# Patient Record
Sex: Male | Born: 1945 | Race: White | Hispanic: No | Marital: Married | Smoking: Never smoker
Health system: Southern US, Community
[De-identification: ages and names within clinical notes are randomized; demographics above are authoritative.]

---

## 2004-11-20 ENCOUNTER — Ambulatory Visit: Payer: Self-pay | Admitting: Nephrology

## 2006-11-01 ENCOUNTER — Emergency Department: Payer: Self-pay | Admitting: Unknown Physician Specialty

## 2008-09-19 IMAGING — CT CT HEAD WITHOUT CONTRAST
2 series · 16 of 30 positions shown, 20 images · non-contrast
Comparison: none

REASON FOR EXAM: ha   pt in er [place]
COMMENTS:

[Series 2: without · axial · non-contrast · 0.41mm/px · z∈[-94,+32]mm · 13 of 31 slices shown, 17 images]
[im 3/31  brain]
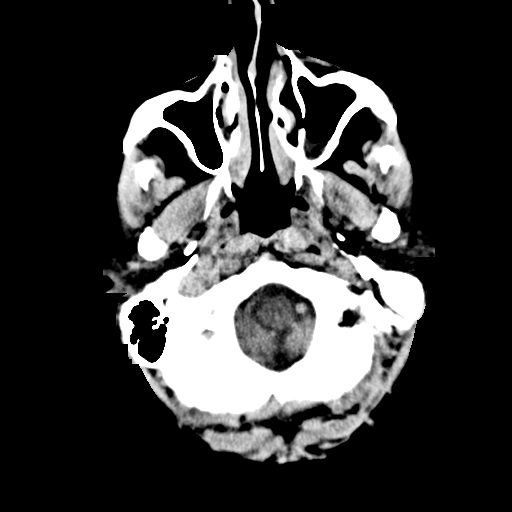
[im 3/31  bone]
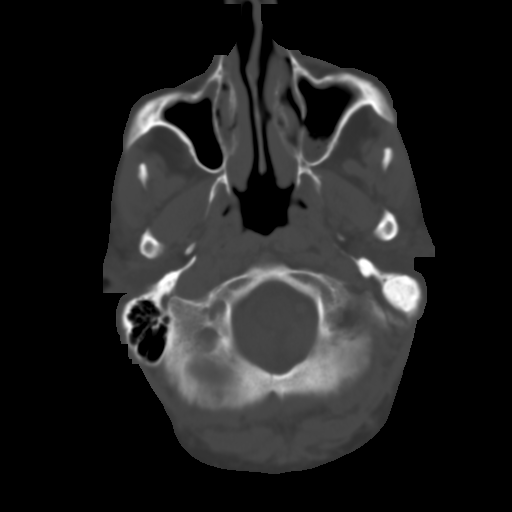
[im 5/31  brain]
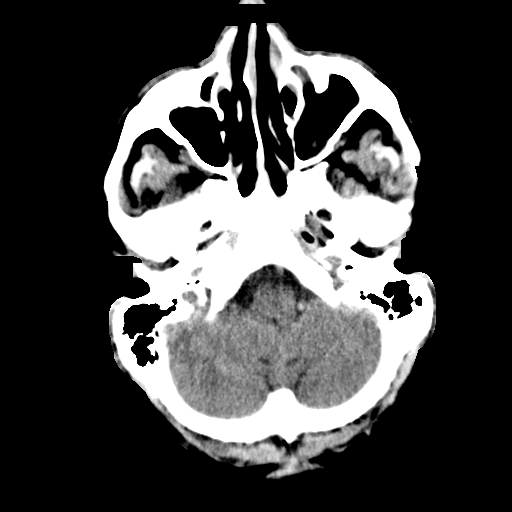
[im 7/31  brain]
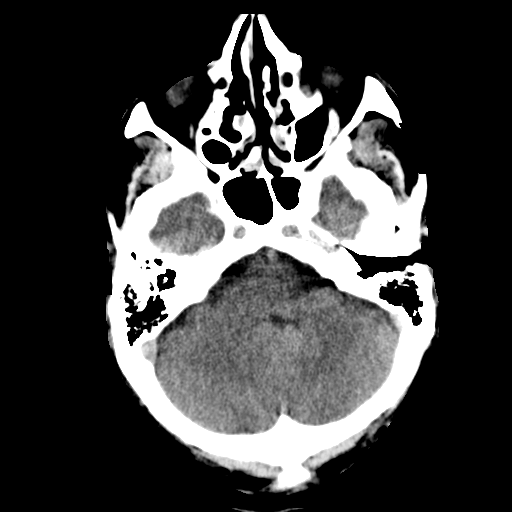
[im 9/31  brain]
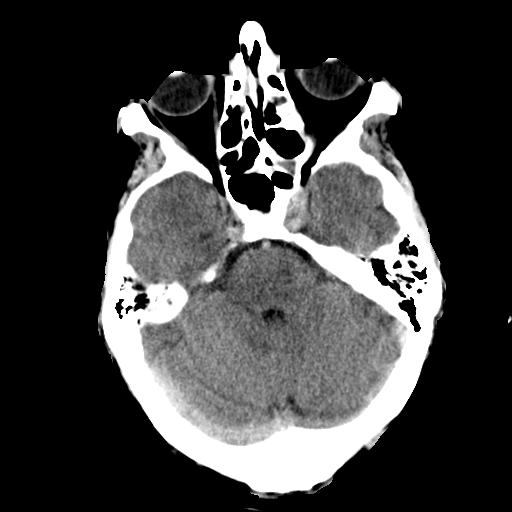
[im 11/31  brain]
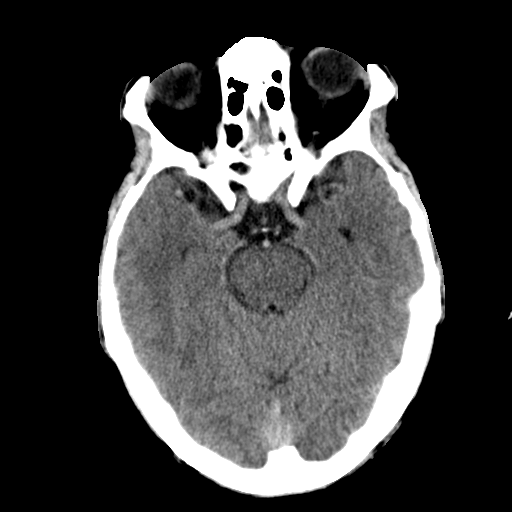
[im 11/31  bone]
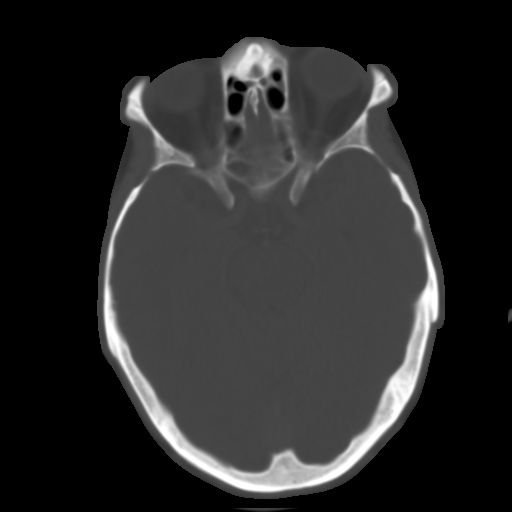
[im 13/31  brain]
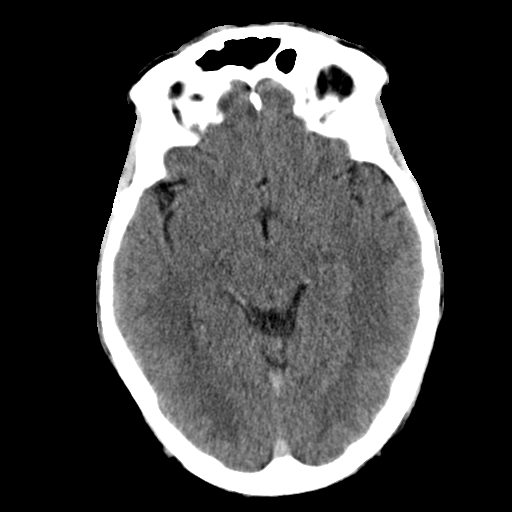
[im 16/31  brain]
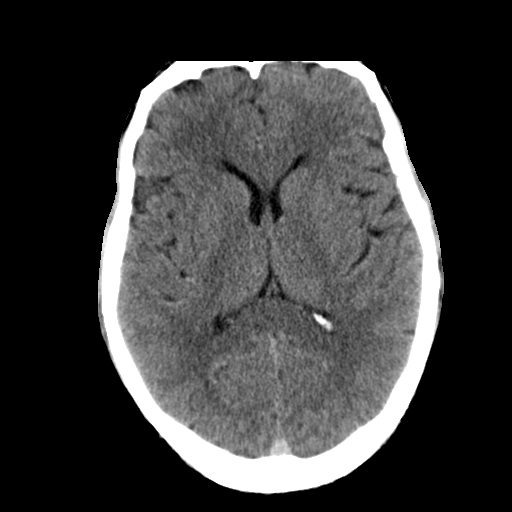
[im 18/31  brain]
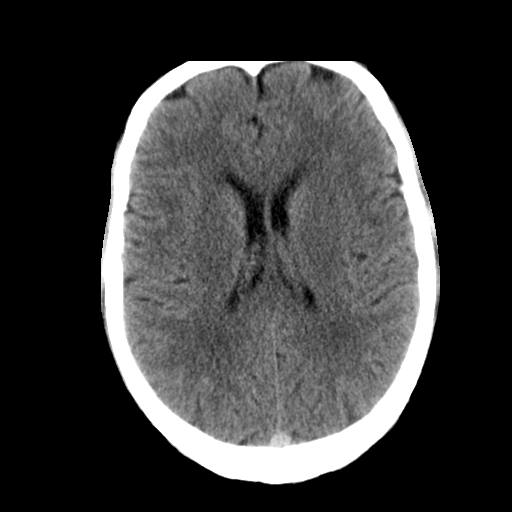
[im 20/31  brain]
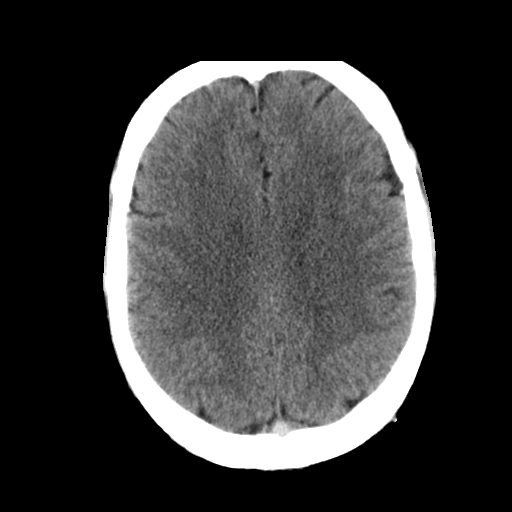
[im 20/31  bone]
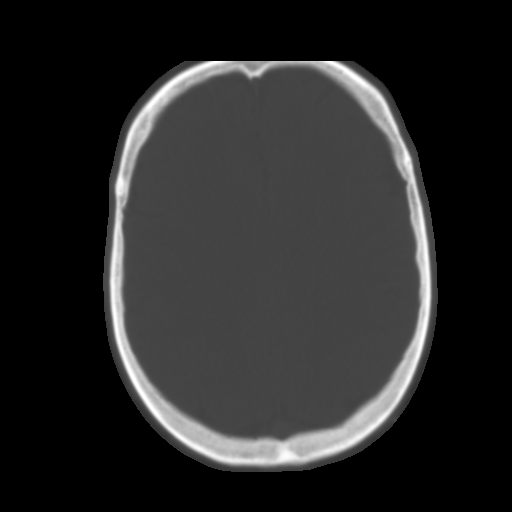
[im 22/31  brain]
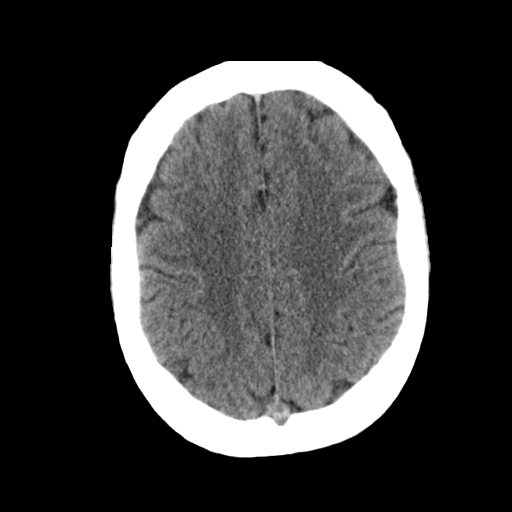
[im 24/31  brain]
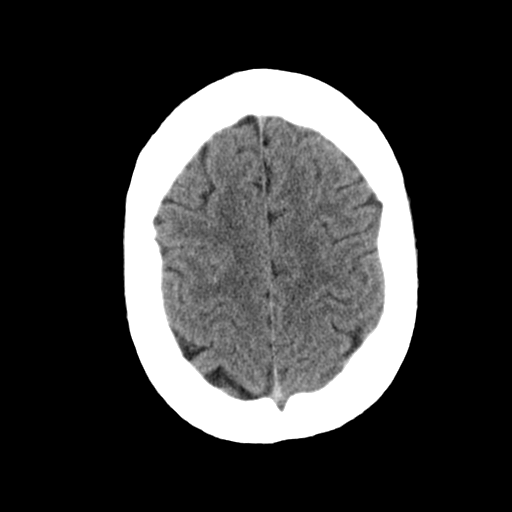
[im 26/31  brain]
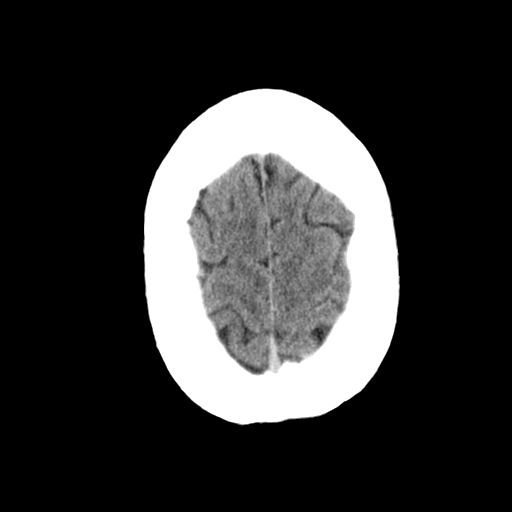
[im 28/31  brain]
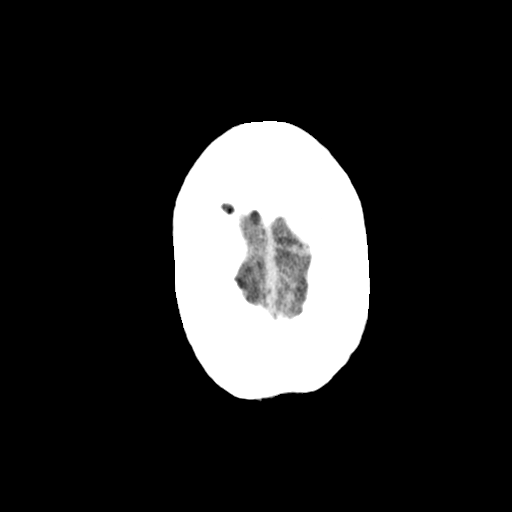
[im 28/31  bone]
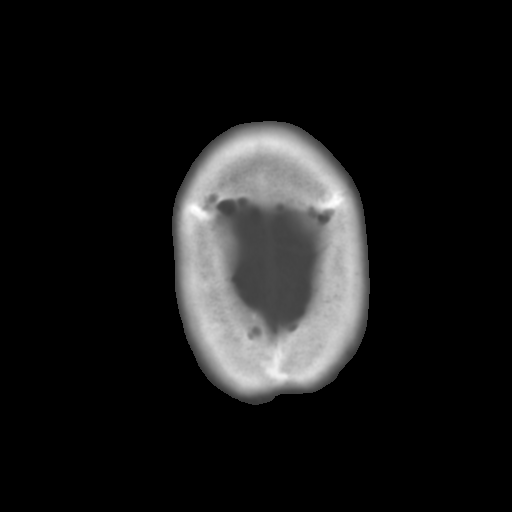

[Series 3: bone · axial · 0.41mm/px · z∈[-94,-54]mm · 3 of 31 slices shown]
[im 3/31  bone]
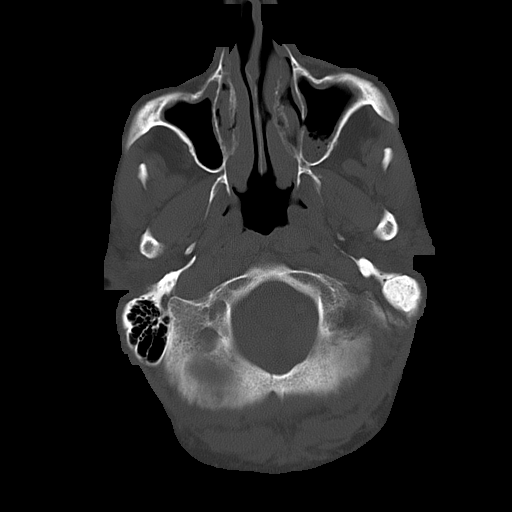
[im 7/31  bone]
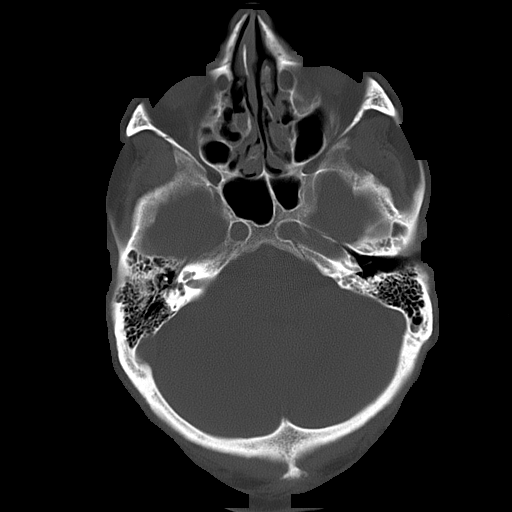
[im 11/31  bone]
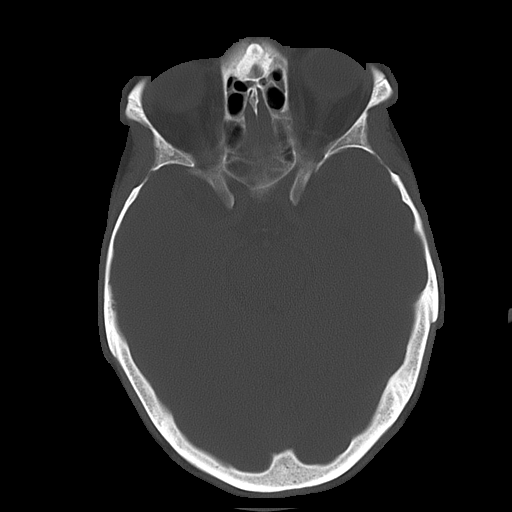

[16 of 30 positions shown; findings below may reference images not displayed]

PROCEDURE:     CT  - CT HEAD WITHOUT CONTRAST  - November 01, 2006  [DATE]

RESULT:       Unenhanced emergent Head CT was performed for headaches.  No
subarachnoid hemorrhage, no intracerebral bleeds, no shift of the midline.
The ventricles appear within normal limits.  No extraaxial fluid collections
are noted.

On the bone window settings, there is noted LEFT frontal, bilateral ethmoid
opacifications and mucoperiosteal thickening of the maxillary sinuses.  The
findings can be compatible with sinusitis.
IMPRESSION: 1.     No acute intracranial abnormality is seen.
2.     Sinusitis as described above.

Results were called to the Emergency Room.

## 2009-10-22 ENCOUNTER — Ambulatory Visit: Payer: Self-pay | Admitting: Gastroenterology

## 2010-02-05 ENCOUNTER — Ambulatory Visit: Payer: Self-pay | Admitting: Internal Medicine

## 2010-02-28 ENCOUNTER — Ambulatory Visit: Payer: Self-pay | Admitting: Internal Medicine

## 2010-03-07 ENCOUNTER — Ambulatory Visit: Payer: Self-pay | Admitting: Internal Medicine

## 2010-04-17 ENCOUNTER — Ambulatory Visit: Payer: Self-pay | Admitting: Internal Medicine

## 2010-05-08 ENCOUNTER — Ambulatory Visit: Payer: Self-pay | Admitting: Internal Medicine

## 2010-06-07 ENCOUNTER — Ambulatory Visit: Payer: Self-pay | Admitting: Internal Medicine

## 2010-07-08 ENCOUNTER — Ambulatory Visit: Payer: Self-pay | Admitting: Internal Medicine

## 2010-08-07 ENCOUNTER — Ambulatory Visit: Payer: Self-pay | Admitting: Internal Medicine

## 2010-09-07 ENCOUNTER — Ambulatory Visit: Payer: Self-pay | Admitting: Internal Medicine

## 2010-10-08 ENCOUNTER — Ambulatory Visit: Payer: Self-pay | Admitting: Internal Medicine

## 2012-04-08 IMAGING — US ABDOMEN ULTRASOUND
1 series · 17 of 25 positions shown · non-contrast
Comparison: none

REASON FOR EXAM: POLYCYTHEMIA EVAL FOR SPLENOMEGOLY
COMMENTS:

[Series 1: abdomen ultrasound · 17 of 95 slices shown]
[im 1/95]
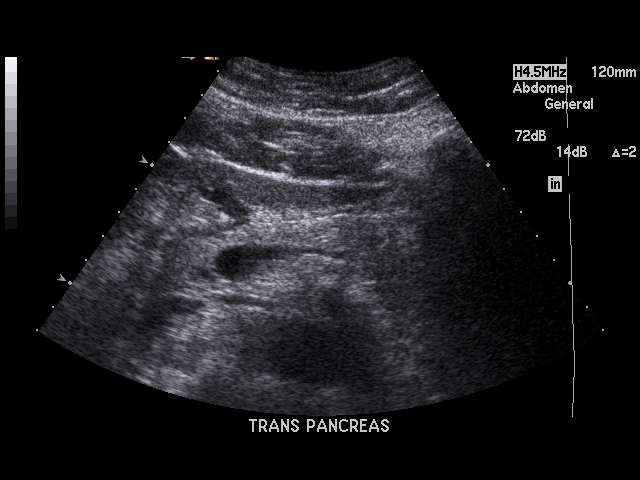
[im 8/95]
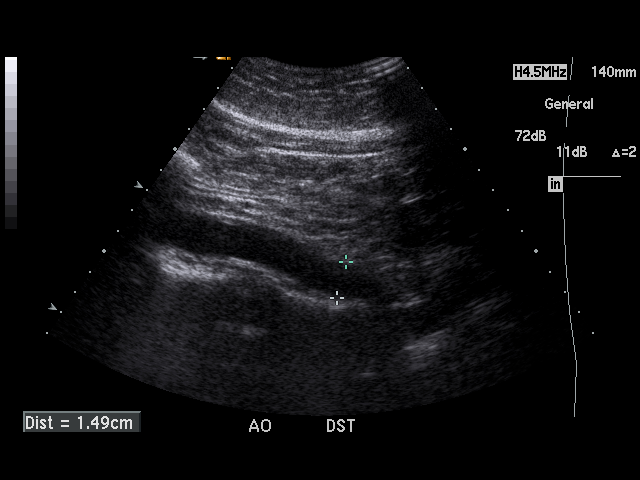
[im 12/95]
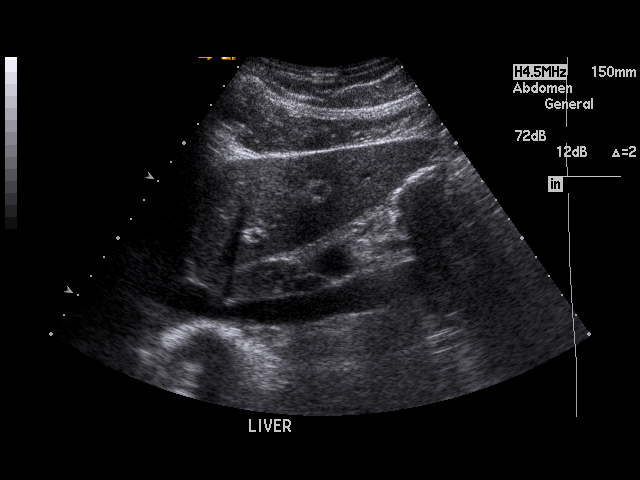
[im 20/95]
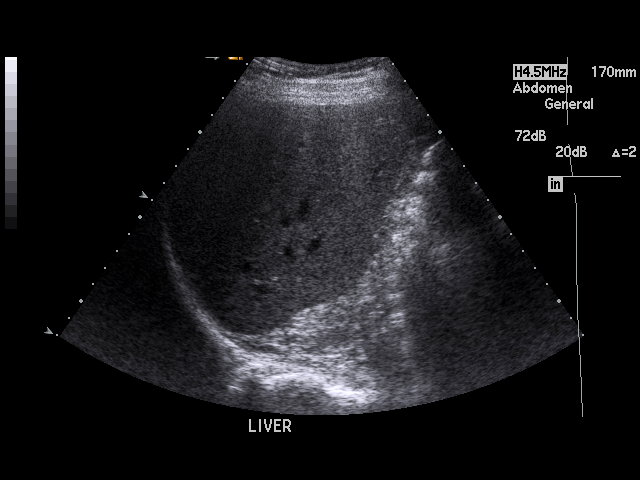
[im 24/95]
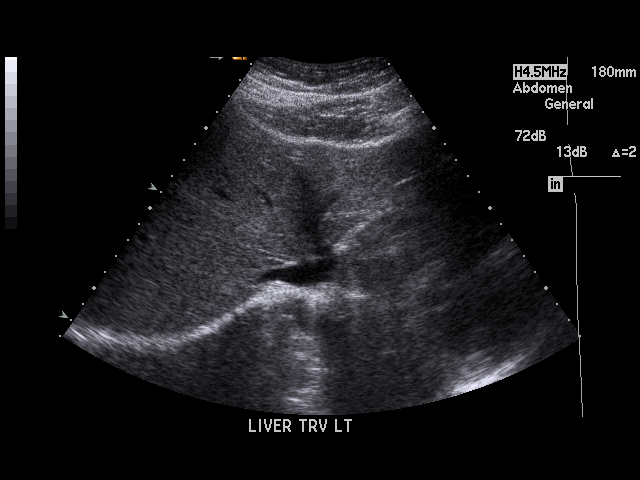
[im 32/95]
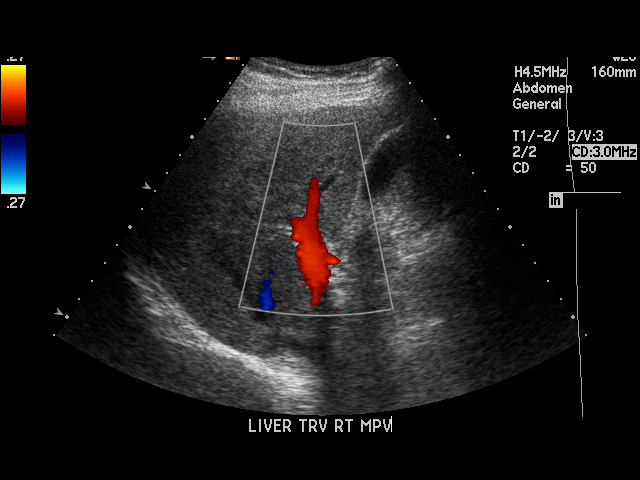
[im 36/95]
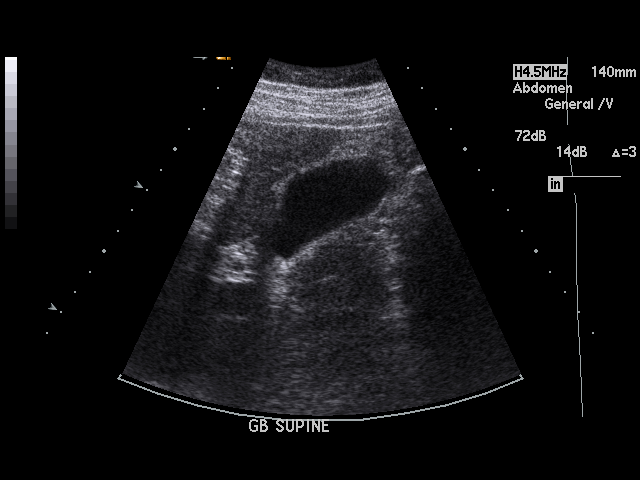
[im 44/95]
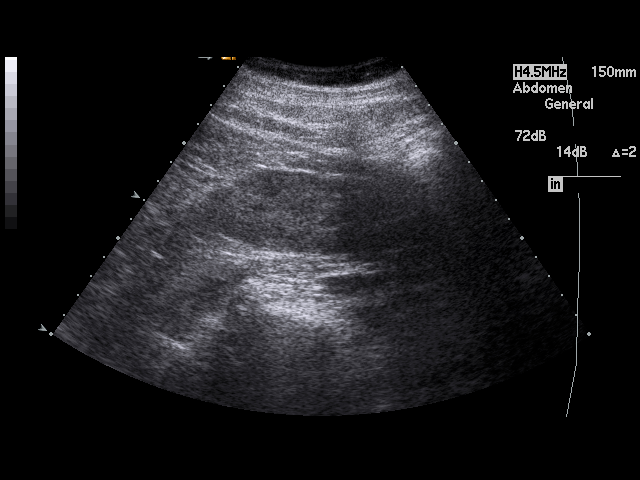
[im 48/95]
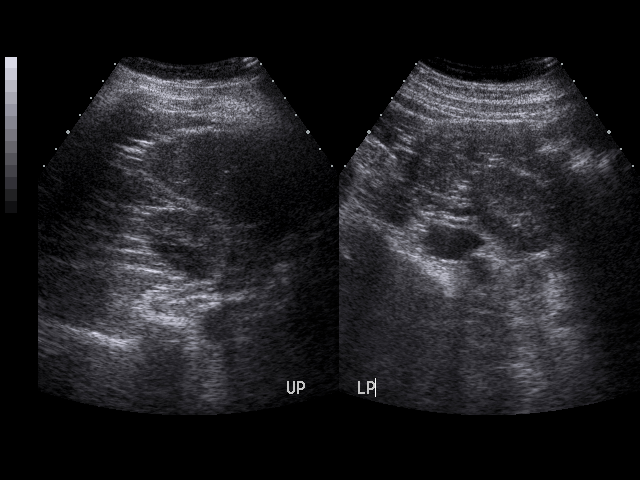
[im 51/95]
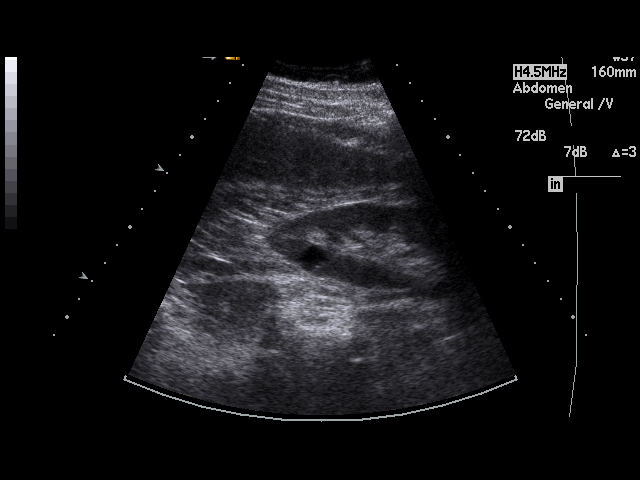
[im 59/95]
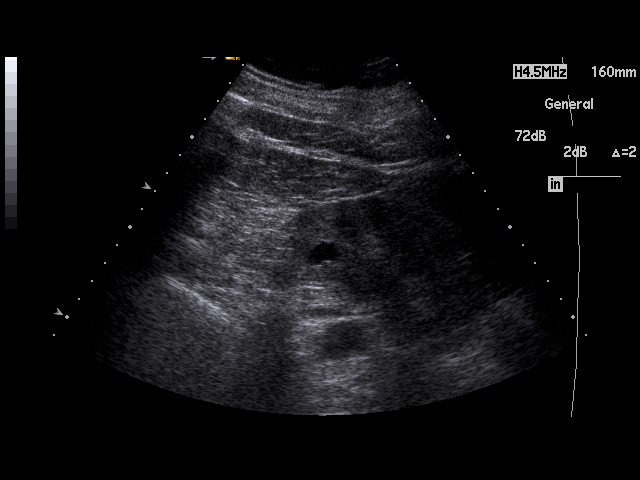
[im 63/95]
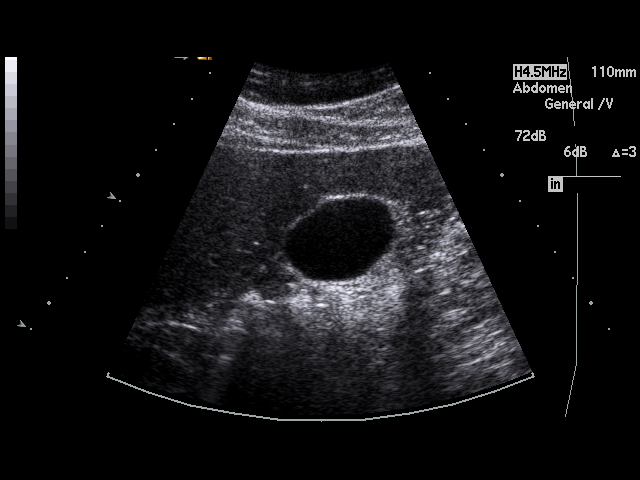
[im 71/95]
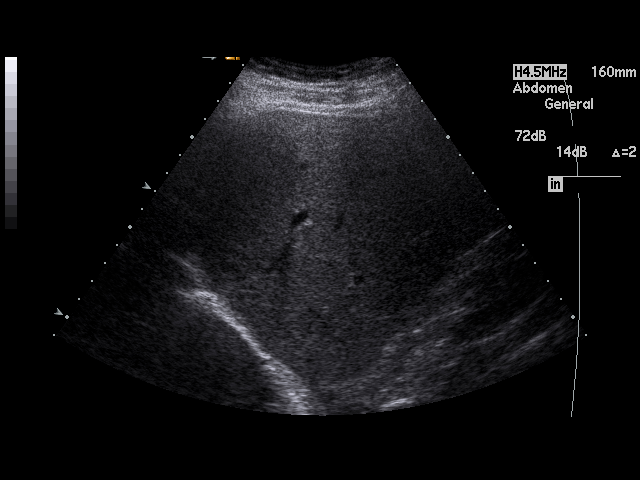
[im 75/95]
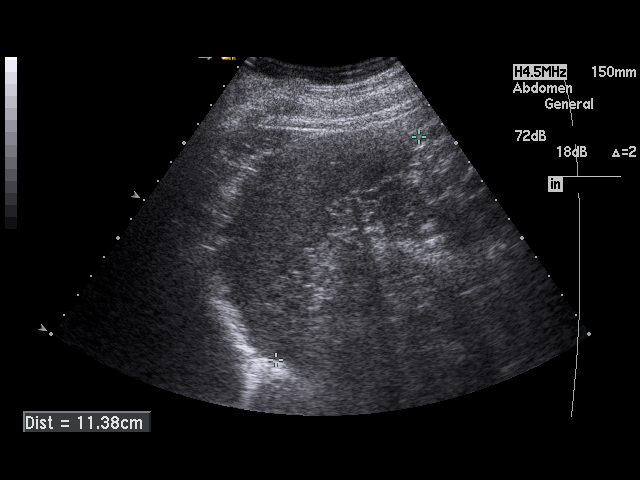
[im 83/95]
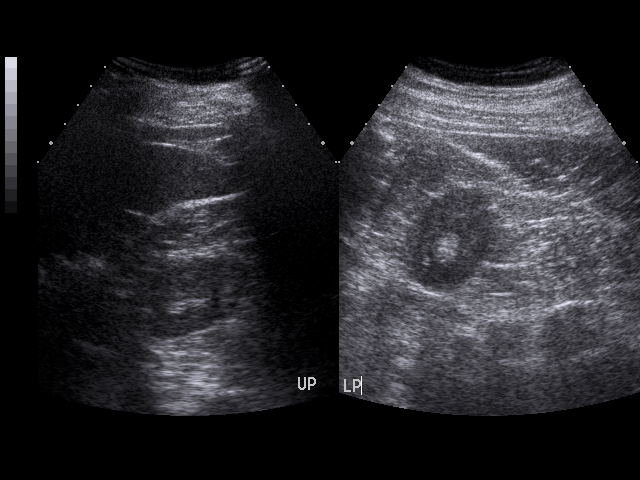
[im 87/95]
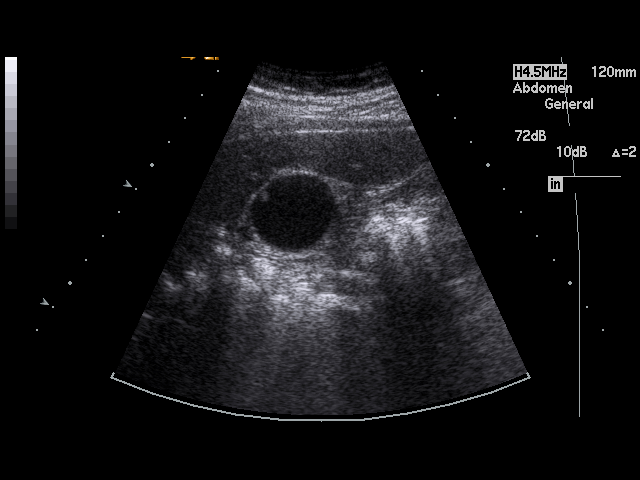
[im 95/95]
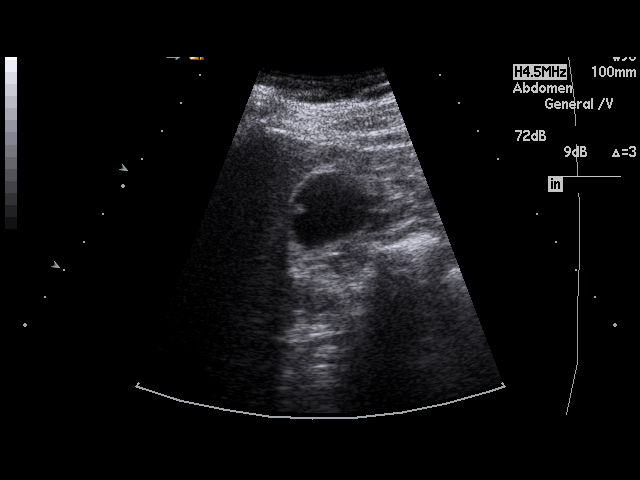

[17 of 25 positions shown; findings below may reference images not displayed]

PROCEDURE:     US  - US ABDOMEN GENERAL SURVEY  - May 21, 2010  [DATE]

RESULT:     The liver demonstrates a homogeneous echotexture. Hepatopetal
flow is identified within the portal vein. The aorta and IVC, visualized
portions, are unremarkable. The pancreas is partially visualized and is
unremarkable. The spleen measures 11.37 cm in longitudinal dimension and
demonstrates a homogeneous echotexture.

Evaluation of the gallbladder fossa demonstrates a polyp measuring 3.9 mm.
Gallbladder wall thickness is 2.1 mm and the common bile duct measures
mm in diameter. The patient does not demonstrate a sonographic Murphy's
sign. A cyst is appreciated involving the right kidney measuring 1.46 x
cm in diameter. The left kidney is otherwise unremarkable.
IMPRESSION: 1.     Small right kidney.
2.     The spleen appears intact and there is no evidence of splenomegaly.
3.     No further focal or acute abnormalities identified.

## 2022-07-28 ENCOUNTER — Observation Stay: Payer: BC Managed Care – PPO

## 2022-07-28 ENCOUNTER — Observation Stay
Admission: EM | Admit: 2022-07-28 | Discharge: 2022-07-29 | Disposition: A | Discharge status: Home / Self Care | Payer: BC Managed Care – PPO | Attending: Internal Medicine | Admitting: Internal Medicine

## 2022-07-28 ENCOUNTER — Emergency Department: Payer: BC Managed Care – PPO

## 2022-07-28 ENCOUNTER — Observation Stay
Admit: 2022-07-28 | Discharge: 2022-07-28 | Disposition: A | Discharge status: Home / Self Care | Payer: BC Managed Care – PPO | Attending: Family Medicine | Admitting: Family Medicine

## 2022-07-28 ENCOUNTER — Other Ambulatory Visit: Payer: Self-pay

## 2022-07-28 DIAGNOSIS — I10 Essential (primary) hypertension: Secondary | ICD-10-CM | POA: Diagnosis not present

## 2022-07-28 DIAGNOSIS — R55 Syncope and collapse: Secondary | ICD-10-CM | POA: Diagnosis not present

## 2022-07-28 DIAGNOSIS — G8929 Other chronic pain: Secondary | ICD-10-CM

## 2022-07-28 DIAGNOSIS — R519 Headache, unspecified: Secondary | ICD-10-CM | POA: Diagnosis not present

## 2022-07-28 DIAGNOSIS — R748 Abnormal levels of other serum enzymes: Secondary | ICD-10-CM | POA: Diagnosis not present

## 2022-07-28 DIAGNOSIS — R7989 Other specified abnormal findings of blood chemistry: Secondary | ICD-10-CM | POA: Diagnosis not present

## 2022-07-28 LAB — COMPREHENSIVE METABOLIC PANEL
ALT: 20 U/L (ref 0–44)
AST: 22 U/L (ref 15–41)
Albumin: 3.8 g/dL (ref 3.5–5.0)
Alkaline Phosphatase: 105 U/L (ref 38–126)
Anion gap: 10 (ref 5–15)
BUN: 52 mg/dL — ABNORMAL HIGH (ref 8–23)
CO2: 19 mmol/L — ABNORMAL LOW (ref 22–32)
Calcium: 9 mg/dL (ref 8.9–10.3)
Chloride: 108 mmol/L (ref 98–111)
Creatinine, Ser: 2.82 mg/dL — ABNORMAL HIGH (ref 0.61–1.24)
GFR, Estimated: 23 mL/min — ABNORMAL LOW (ref >60)
Glucose, Bld: 116 mg/dL — ABNORMAL HIGH (ref 70–99)
Potassium: 4 mmol/L (ref 3.5–5.1)
Sodium: 137 mmol/L (ref 135–145)
Total Bilirubin: 1.1 mg/dL (ref 0.3–1.2)
Total Protein: 6.9 g/dL (ref 6.5–8.1)

## 2022-07-28 LAB — URINALYSIS, COMPLETE (UACMP) WITH MICROSCOPIC
Bacteria, UA: NONE SEEN
Bilirubin Urine: NEGATIVE
Glucose, UA: NEGATIVE mg/dL
Ketones, ur: NEGATIVE mg/dL
Leukocytes,Ua: NEGATIVE
Nitrite: NEGATIVE
Protein, ur: 100 mg/dL — AB
RBC / HPF: >50 RBC/hpf — ABNORMAL HIGH (ref 0–5)
Specific Gravity, Urine: 1.015 (ref 1.005–1.030)
pH: 5 (ref 5.0–8.0)

## 2022-07-28 LAB — CBC WITH DIFFERENTIAL/PLATELET
Abs Immature Granulocytes: 0.06 10*3/uL (ref 0.00–0.07)
Basophils Absolute: 0.1 10*3/uL (ref 0.0–0.1)
Basophils Relative: 1 %
Eosinophils Absolute: 0.2 10*3/uL (ref 0.0–0.5)
Eosinophils Relative: 2 %
HCT: 48.5 % (ref 39.0–52.0)
Hemoglobin: 16.1 g/dL (ref 13.0–17.0)
Immature Granulocytes: 1 %
Lymphocytes Relative: 20 %
Lymphs Abs: 2 10*3/uL (ref 0.7–4.0)
MCH: 30 pg (ref 26.0–34.0)
MCHC: 33.2 g/dL (ref 30.0–36.0)
MCV: 90.3 fL (ref 80.0–100.0)
Monocytes Absolute: 0.6 10*3/uL (ref 0.1–1.0)
Monocytes Relative: 6 %
Neutro Abs: 7.2 10*3/uL (ref 1.7–7.7)
Neutrophils Relative %: 70 %
Platelets: 237 10*3/uL (ref 150–400)
RBC: 5.37 MIL/uL (ref 4.22–5.81)
RDW: 12.3 % (ref 11.5–15.5)
WBC: 10.1 10*3/uL (ref 4.0–10.5)
nRBC: 0 % (ref 0.0–0.2)

## 2022-07-28 LAB — TROPONIN I (HIGH SENSITIVITY)
Troponin I (High Sensitivity): 7 ng/L (ref <18)
Troponin I (High Sensitivity): 10 ng/L (ref <18)

## 2022-07-28 LAB — D-DIMER, QUANTITATIVE: D-Dimer, Quant: 1.23 ug/mL-FEU — ABNORMAL HIGH (ref 0.00–0.50)

## 2022-07-28 MED ORDER — FENTANYL CITRATE PF 50 MCG/ML IJ SOSY
50.0000 ug | PREFILLED_SYRINGE | Freq: Once | INTRAMUSCULAR | Status: AC
  Administered 2022-07-28: 50 ug via INTRAVENOUS
  Filled 2022-07-28: qty 1

## 2022-07-28 MED ORDER — NORTRIPTYLINE HCL 10 MG PO CAPS
10.0000 mg | ORAL_CAPSULE | Freq: Every day | ORAL | Status: DC
  Administered 2022-07-28: 10 mg via ORAL
  Filled 2022-07-28: qty 1

## 2022-07-28 MED ORDER — HEPARIN BOLUS VIA INFUSION
5000.0000 [IU] | Freq: Once | INTRAVENOUS | Status: AC
  Administered 2022-07-28: 5000 [IU] via INTRAVENOUS
  Filled 2022-07-28: qty 5000

## 2022-07-28 MED ORDER — LACTATED RINGERS IV BOLUS
1000.0000 mL | Freq: Once | INTRAVENOUS | Status: AC
  Administered 2022-07-28: 1000 mL via INTRAVENOUS

## 2022-07-28 MED ORDER — HEPARIN (PORCINE) 25000 UT/250ML-% IV SOLN
1400.0000 [IU]/h | INTRAVENOUS | Status: DC
  Administered 2022-07-28: 1400 [IU]/h via INTRAVENOUS
  Filled 2022-07-28: qty 250

## 2022-07-28 MED ORDER — LISINOPRIL 10 MG PO TABS
20.0000 mg | ORAL_TABLET | Freq: Every day | ORAL | Status: DC
  Administered 2022-07-29: 20 mg via ORAL
  Filled 2022-07-28: qty 2

## 2022-07-28 NOTE — Assessment & Plan Note (Signed)
-   Creatinine 2.82 - Last creatinine in our system was in 2018 1.7 - Patient reports that he does have stage IV kidney disease - Baseline is likely in the twos, but this is probably elevated for him given the BUN to creatinine ratio of 52:2.82, and decreased bicarb at 19 - Continue gentle hydration - Holding hydrochlorothiazide, reducing lisinopril - Continue to monitor

## 2022-07-28 NOTE — Assessment & Plan Note (Signed)
-   Elevated D-dimer is 1.23 - Patient has had some tachypnea and hypotension with paramedics - Concern for pulmonary embolism - VQ scan in the a.m. secondary to elevated creatinine - Heparin drip was started in the ED, continue - VQ was positive for pulmonary embolism, will proceed with PE work-up

## 2022-07-28 NOTE — H&P (Signed)
History and Physical    Patient: Jackson Ray WUJ:[id number] DOB: 04-05-1946 DOA: 07/28/2022 DOS: the patient was seen and examined on 07/28/2022 PCP: System, Provider Not In  Patient coming from: Home  Chief Complaint:  Chief Complaint  Patient presents with   Near Syncope    Pt arrived via ems, for near syncopal episodes.  Pt states he had 2 episodes yesterday where he felt lightheaded and dizzy.  This morning he states he fell twice this morning when waking up to go to the bathroom, denies hitting head. Fell 2x unwitnessed at work and 1x witnessed where he hit his head.  Bump/small laceration noted on the top left side of head.  Pt denies LOC.  Finished antibiotics for recent uti, also states blood in urine.  Has self cathed 5-6x over the last month. Upon arrival hands wer   HPI: Jackson Ray is a 76 y.o. male with medical history significant of prostate disease, hypertension, CSF leak, chronic headaches, and more presents to the ED with a chief complaint of syncope.  Patient reports he started having dizziness upon standing day before yesterday.  In the middle of the night, when he woke up to go to the bathroom, he passed out on the way to the bathroom and on the way back to bed.  He thought he might be unable to go to work today, but when he woke up he was feeling okay so he went to work.  He reports at work he had 4 syncopal episodes.  Last when he hit his head pretty hard and was unconscious longer, so work made him come into the hospital.  Patient reports that the syncopal episodes never happen when he is sitting or laying.  They only happen when he gets up and walks a few steps.  He feels dizziness briefly beforehand, but reports that he pretty suddenly passes out.  He denies any chest pains, dyspnea, palpitations.  He denies any new medications.  The only change in his medical regimen is that he used to straight cath at night, but he started having some bleeding yesterday, so they advised  him to stop straight cathing.  Patient reports he has had dizziness and syncopal episodes like this before when he had a CSF leak.  It is difficult to piece together, but the CSF leak is apparently spontaneous, and he had several blood patches before procedure was done to permanently fix the problem.  Patient reports that since that time he has had a headache every day.  Most days it is mild, some days it is worse.  He takes nortriptyline at night for this.  His headache is been no worse than normal recently.  Patient reports it isstopped up, so he has to mouth breathe which sometimes makes him feel short of breath.  He has felt no more short of breath recently than his normal.  He did hit the back of his head today on the right side.  He has a small tender knot.  He reports no change in vision, no change in hearing, no asymmetric weakness, no trouble word finding, no trouble swallowing.  Patient has no other complaints at this time.  Patient is DNR.  Patient does not smoke, does not drink, does not use illicit drugs. Review of Systems: As mentioned in the history of present illness. All other systems reviewed and are negative. History reviewed. No pertinent past medical history. History reviewed. No pertinent surgical history. Social History:  has no history on file  for tobacco use, alcohol use, and drug use.  No Known Allergies  History reviewed. No pertinent family history.  Prior to Admission medications   Not on File    Physical Exam: Vitals:   07/28/22 1803 07/28/22 1811 07/28/22 1900 07/28/22 2000  BP:   (!) 140/92 (!) 137/90  Pulse: 77  83 83  Resp: 18  (!) 31 (!) 32  Temp:   98.5 F (36.9 C)   TempSrc:   Oral   SpO2: 100%  100% 98%  Weight:  81.6 kg    Height:  6\' 2"  (1.88 m)     1.  General: Patient lying supine in bed,  no acute distress   2. Psychiatric: Alert and oriented x 3, mood and behavior normal for situation, pleasant and cooperative with exam   3.  Neurologic: Speech and language are normal, face is symmetric, moves all 4 extremities voluntarily, at baseline without acute deficits on limited exam   4. HEENMT:   tender knot on right occiput, normocephalic, pupils reactive to light, neck is supple, trachea is midline, mucous membranes are moist   5. Respiratory : Lungs are clear to auscultation bilaterally without wheezing, rhonchi, rales, no cyanosis, no increase in work of breathing or accessory muscle use   6. Cardiovascular : Heart rate normal, rhythm is regular, no murmurs, rubs or gallops, no peripheral edema, peripheral pulses palpated   7. Gastrointestinal:  Abdomen is soft, nondistended, nontender to palpation bowel sounds active, no masses or organomegaly palpated   8. Skin:  Skin is warm, dry and intact without rashes, acute lesions, or ulcers on limited exam   9.Musculoskeletal:  No acute deformities or trauma, no asymmetry in tone, no peripheral edema, peripheral pulses palpated, no tenderness to palpation in the extremities  Data Reviewed: The ED Temp 97.5-98.5, heart rate 53-86, respiratory rate 18-32, EMS reports blood pressure as low as 80/40 per son, but BP in the ED was 121/81-140/92 satting 90s to 100%.  There is 1 oxygen saturation at 70%, but it was 100% 4 minutes later with no intervention so this is likely an inaccurate reading No leukocytosis with white blood cell count 10.1, hemoglobin 16.1, platelets 237 Chemistry shows an elevated BUN 52, elevated creatinine 2.82 Trope 7, 10 D-dimer 1.23 UA shows greater than 50 red blood cells possibly due to a traumatic straight cath CT C-spine and head showed no acute intracranial or C-spine abnormality.  Tiny metal foreign body that has been present since 2008 along the right lateral side of the nose EKG shows a heart rate of 80, QTc 490, sinus rhythm Heparin drip started in the ED for PE Fentanyl given for headache Admission requested for syncope  work-up  Assessment and Plan: * Syncope - Most likely orthostatic hypotension given that this happened when patient would stand up and walk a few steps - Orthostatic vital signs every shift - Echo in the a.m. - Monitor on telemetry - Troponin 7,10 - D-dimer elevated, V/Q in the a.m. - UA shows hematuria, but patient's hemoglobin is stable at 16.1 - EKG shows a heart rate of 80, QTc 490, sinus rhythm - Continue to monitor  Chronic headaches - History of multiple CSF leaks requiring blood patch - Chronic headaches ever since - Continue home nortriptyline - Tylenol for mild pain or headache here - CT head showed no acute intracranial changes - Given fall and minor head trauma today, neurochecks every 4 hours - Continue to monitor  Essential hypertension - At baseline  patient is on max dose amlodipine, max dose lisinopril, and 25 mg of hydrochlorothiazide - Patient was hypotensive with paramedics - Holding amlodipine, hydrochlorothiazide, reducing dose of lisinopril -Continue to monitor  Elevated d-dimer - Elevated D-dimer is 1.23 - Patient has had some tachypnea and hypotension with paramedics - Concern for pulmonary embolism - VQ scan in the a.m. secondary to elevated creatinine - Heparin drip was started in the ED, continue - VQ was positive for pulmonary embolism, will proceed with PE work-up  Elevated serum creatinine - Creatinine 2.82 - Last creatinine in our system was in 2018 1.7 - Patient reports that he does have stage IV kidney disease - Baseline is likely in the twos, but this is probably elevated for him given the BUN to creatinine ratio of 52:2.82, and decreased bicarb at 19 - Continue gentle hydration - Holding hydrochlorothiazide, reducing lisinopril - Continue to monitor      Advance Care Planning:   Code Status: Not on file DNR  Consults: None so far, may consider neuro consult given history of CSF leak if no other etiology of syncope is  identified  Family Communication: Son at bedside  Severity of Illness: The appropriate patient status for this patient is OBSERVATION. Observation status is judged to be reasonable and necessary in order to provide the required intensity of service to ensure the patient's safety. The patient's presenting symptoms, physical exam findings, and initial radiographic and laboratory data in the context of their medical condition is felt to place them at decreased risk for further clinical deterioration. Furthermore, it is anticipated that the patient will be medically stable for discharge from the hospital within 2 midnights of admission.   Author: Lilyan Gilford, DO 07/28/2022 10:10 PM  For on call review [email or web address].

## 2022-07-28 NOTE — ED Notes (Signed)
Not on blood thinners 

## 2022-07-28 NOTE — ED Provider Notes (Signed)
[facility] Provider Note    Event Date/Time   First MD Initiated Contact with Patient 07/28/22 1655     (approximate)   History   Syncope  HPI  Jackson Ray is a 76 y.o. male who presents to the emergency department today via EMS because of concerns for syncope.  Patient states that he has felt dizzy since yesterday.  Overnight when he went to the bathroom and he stated that he passed out.  He then fell again in the bathroom but then felt better so went to bed and then felt good enough to go to work this morning.  While at work however he says he passed out again.  He denies any chest pain or palpitations with the syncopal episodes.  Is complaining of some head and neck pain.  Also has noticed some numbness to his left hand and foot.     Physical Exam   Triage Vital Signs: ED Triage Vitals  Enc Vitals Group     BP 07/28/22 1659 131/81     Pulse Rate 07/28/22 1659 86     Resp 07/28/22 1655 (!) 22     Temp 07/28/22 1659 (!) 97.5 F (36.4 C)     Temp Source 07/28/22 1659 Oral     SpO2 07/28/22 1655 (!) 70 %     Weight --      Height --      Head Circumference --      Peak Flow --      Pain Score --      Pain Loc --      Pain Edu? --      Excl. in GC? --     Most recent vital signs: Vitals:   07/28/22 1655 07/28/22 1659  BP:  131/81  Pulse:  86  Resp: (!) 22   Temp:  (!) 97.5 F (36.4 C)  SpO2: (!) 70% 100%   General: Awake, alert, oriented. CV:  Good peripheral perfusion. Regular rate and rhythm. Resp:  Normal effort. Lungs clear. Abd:  No distention. Non tender. Other:  Discoloration to left 2nd and 3rd digit tips. RP 2+. No discoloration to right upper extremity or bilateral lower legs. DP 1+ bilaterally.   ED Results / Procedures / Treatments   Labs (all labs ordered are listed, but only abnormal results are displayed) Labs Reviewed  COMPREHENSIVE METABOLIC PANEL - Abnormal; Notable for the following components:      Result  Value   CO2 19 (*)    Glucose, Bld 116 (*)    BUN 52 (*)    Creatinine, Ser 2.82 (*)    GFR, Estimated 23 (*)    All other components within normal limits  URINALYSIS, COMPLETE (UACMP) WITH MICROSCOPIC - Abnormal; Notable for the following components:   Color, Urine YELLOW (*)    APPearance CLEAR (*)    Hgb urine dipstick MODERATE (*)    Protein, ur 100 (*)    RBC / HPF >50 (*)    All other components within normal limits  D-DIMER, QUANTITATIVE - Abnormal; Notable for the following components:   D-Dimer, Quant 1.23 (*)    All other components within normal limits  CBC WITH DIFFERENTIAL/PLATELET  HEPARIN LEVEL (UNFRACTIONATED)  CBC  TROPONIN I (HIGH SENSITIVITY)  TROPONIN I (HIGH SENSITIVITY)     EKG  I, Phineas Semen, attending physician, personally viewed and interpreted this EKG  EKG Time: 1654 Rate: 80 Rhythm: atrial rhythm Axis: left axis deviation Intervals:  qtc 490 QRS: RBBB, LAFB ST changes: no st elevation Impression: abnormal ekg   PROCEDURES:  Critical Care performed: No  Procedures   MEDICATIONS ORDERED IN ED: Medications - No data to display   IMPRESSION / MDM / ASSESSMENT AND PLAN / ED COURSE  I reviewed the triage vital signs and the nursing notes.                              Differential diagnosis includes, but is not limited to, vasovagal, dehydration, arrythmia, PE, electrolyte abnormality.  Patient's presentation is most consistent with acute presentation with potential threat to life or bodily function.  The patient is on the cardiac monitor to evaluate for evidence of arrhythmia and/or significant heart rate changes.  Patient presented to the emergency department today after suffering a syncopal episode at work.  Patient already had syncopal episode at home today.  On exam patient is awake and alert.  No arrhythmia.  Did notice some discoloration to his left second and third digits.  When asked about this he says this is happened to  him once before many years ago.  He does have good pulses in all extremities.   CT head and cervical spine without any acute abnormality. Blood work without concerning anemia. Patient's creatinine is elevated but he does state he has stage iv kidney disease. Did have concern for PE and d-dimer was elevated but unfortunately could not obtain ct angio given kidney function and v/q scan not available at night. Will however start heparin given concern for PE. Discussed with Dr. Carren Rang with the hospitalist service who will plan on admission.     FINAL CLINICAL IMPRESSION(S) / ED DIAGNOSES   Syncope    Note:  This document was prepared using Dragon voice recognition software and may include unintentional dictation errors.    Phineas Semen, MD 07/28/22 2154

## 2022-07-28 NOTE — Consult Note (Signed)
ANTICOAGULATION CONSULT NOTE - Initial Consult  Pharmacy Consult for heparin infusion Indication: pulmonary embolus  No Known Allergies  Patient Measurements: Height: 6\' 2"  (188 cm) Weight: 81.6 kg (180 lb) IBW/kg (Calculated) : 82.2 Heparin Dosing Weight: 81.6 kg  Vital Signs: Temp: 98.5 F (36.9 C) (11/21 1900) Temp Source: Oral (11/21 1900) BP: 137/90 (11/21 2000) Pulse Rate: 83 (11/21 2000)  Labs: Recent Labs    07/28/22 1658 07/28/22 1923  HGB 16.1  --   HCT 48.5  --   PLT 237  --   CREATININE 2.82*  --   TROPONINIHS 7 10    Estimated Creatinine Clearance: 26.1 mL/min (A) (by C-G formula based on SCr of 2.82 mg/dL (H)).   Medical History: History reviewed. No pertinent past medical history.  Medications:  No prior anticoagulation noted   Assessment: 76 y.o. male who presents to the emergency department today via EMS because of concerns for recurrent syncopal episodes. Concern for PE.  Goal of Therapy:  Heparin level 0.3-0.7 units/ml Monitor platelets by anticoagulation protocol: Yes   Plan:  Give 5000 units bolus x 1 Start heparin infusion at 1400 units/hr Check anti-Xa level in 8 hours and daily while on heparin Continue to monitor H&H and platelets  62, PharmD, BCPS Clinical Pharmacist   07/28/2022,8:32 PM

## 2022-07-28 NOTE — Assessment & Plan Note (Signed)
-   Most likely orthostatic hypotension given that this happened when patient would stand up and walk a few steps - Orthostatic vital signs every shift - Echo in the a.m. - Monitor on telemetry - Troponin 7,10 - D-dimer elevated, V/Q in the a.m. - UA shows hematuria, but patient's hemoglobin is stable at 16.1 - EKG shows a heart rate of 80, QTc 490, sinus rhythm - Continue to monitor

## 2022-07-28 NOTE — Assessment & Plan Note (Signed)
-   History of multiple CSF leaks requiring blood patch - Chronic headaches ever since - Continue home nortriptyline - Tylenol for mild pain or headache here - CT head showed no acute intracranial changes - Given fall and minor head trauma today, neurochecks every 4 hours - Continue to monitor

## 2022-07-28 NOTE — ED Notes (Signed)
Report given to [facility].. pt to be transported to C-Pod room 32.

## 2022-07-28 NOTE — Assessment & Plan Note (Signed)
-   At baseline patient is on max dose amlodipine, max dose lisinopril, and 25 mg of hydrochlorothiazide - Patient was hypotensive with paramedics - Holding amlodipine, hydrochlorothiazide, reducing dose of lisinopril -Continue to monitor

## 2022-07-29 ENCOUNTER — Observation Stay: Payer: BC Managed Care – PPO

## 2022-07-29 ENCOUNTER — Encounter: Payer: Self-pay | Admitting: Family Medicine

## 2022-07-29 DIAGNOSIS — R55 Syncope and collapse: Secondary | ICD-10-CM | POA: Diagnosis not present

## 2022-07-29 LAB — ECHOCARDIOGRAM COMPLETE
Area-P 1/2: 3.5 cm2
Height: 74 in
S' Lateral: 2.2 cm
Weight: 2880 oz

## 2022-07-29 LAB — COMPREHENSIVE METABOLIC PANEL
ALT: 19 U/L (ref 0–44)
AST: 23 U/L (ref 15–41)
Albumin: 3.5 g/dL (ref 3.5–5.0)
Alkaline Phosphatase: 92 U/L (ref 38–126)
Anion gap: 9 (ref 5–15)
BUN: 53 mg/dL — ABNORMAL HIGH (ref 8–23)
CO2: 18 mmol/L — ABNORMAL LOW (ref 22–32)
Calcium: 8.5 mg/dL — ABNORMAL LOW (ref 8.9–10.3)
Chloride: 110 mmol/L (ref 98–111)
Creatinine, Ser: 2.52 mg/dL — ABNORMAL HIGH (ref 0.61–1.24)
GFR, Estimated: 26 mL/min — ABNORMAL LOW (ref >60)
Glucose, Bld: 99 mg/dL (ref 70–99)
Potassium: 3.7 mmol/L (ref 3.5–5.1)
Sodium: 137 mmol/L (ref 135–145)
Total Bilirubin: 0.9 mg/dL (ref 0.3–1.2)
Total Protein: 6.6 g/dL (ref 6.5–8.1)

## 2022-07-29 LAB — CBC WITH DIFFERENTIAL/PLATELET
Abs Immature Granulocytes: 0.05 10*3/uL (ref 0.00–0.07)
Basophils Absolute: 0.1 10*3/uL (ref 0.0–0.1)
Basophils Relative: 0 %
Eosinophils Absolute: 0.3 10*3/uL (ref 0.0–0.5)
Eosinophils Relative: 2 %
HCT: 44.8 % (ref 39.0–52.0)
Hemoglobin: 14.8 g/dL (ref 13.0–17.0)
Immature Granulocytes: 0 %
Lymphocytes Relative: 22 %
Lymphs Abs: 2.6 10*3/uL (ref 0.7–4.0)
MCH: 30.1 pg (ref 26.0–34.0)
MCHC: 33 g/dL (ref 30.0–36.0)
MCV: 91.2 fL (ref 80.0–100.0)
Monocytes Absolute: 0.8 10*3/uL (ref 0.1–1.0)
Monocytes Relative: 7 %
Neutro Abs: 8.1 10*3/uL — ABNORMAL HIGH (ref 1.7–7.7)
Neutrophils Relative %: 69 %
Platelets: 203 10*3/uL (ref 150–400)
RBC: 4.91 MIL/uL (ref 4.22–5.81)
RDW: 12.4 % (ref 11.5–15.5)
WBC: 11.9 10*3/uL — ABNORMAL HIGH (ref 4.0–10.5)
nRBC: 0 % (ref 0.0–0.2)

## 2022-07-29 LAB — TSH: TSH: 0.467 u[IU]/mL (ref 0.350–4.500)

## 2022-07-29 LAB — HEPARIN LEVEL (UNFRACTIONATED): Heparin Unfractionated: 0.65 IU/mL (ref 0.30–0.70)

## 2022-07-29 LAB — MAGNESIUM: Magnesium: 2.1 mg/dL (ref 1.7–2.4)

## 2022-07-29 MED ORDER — TECHNETIUM TO 99M ALBUMIN AGGREGATED: 4.0400 | Freq: Once | INTRAVENOUS | Status: DC | PRN

## 2022-07-29 MED ORDER — ACETAMINOPHEN 325 MG PO TABS
650.0000 mg | ORAL_TABLET | Freq: Four times a day (QID) | ORAL | Status: DC | PRN
  Administered 2022-07-29 (×2): 650 mg via ORAL
  Filled 2022-07-29 (×2): qty 2

## 2022-07-29 MED ORDER — POLYETHYLENE GLYCOL 3350 17 G PO PACK: 17.0000 g | PACK | Freq: Every day | ORAL | Status: DC | PRN

## 2022-07-29 MED ORDER — OXYCODONE HCL 5 MG PO TABS
5.0000 mg | ORAL_TABLET | ORAL | Status: DC | PRN
  Administered 2022-07-29 (×2): 5 mg via ORAL
  Filled 2022-07-29 (×2): qty 1

## 2022-07-29 MED ORDER — SODIUM CHLORIDE 0.9 % IV SOLN: INTRAVENOUS | Status: DC

## 2022-07-29 MED ORDER — ONDANSETRON HCL 4 MG PO TABS: 4.0000 mg | ORAL_TABLET | Freq: Four times a day (QID) | ORAL | Status: DC | PRN

## 2022-07-29 MED ORDER — ONDANSETRON HCL 4 MG/2ML IJ SOLN: 4.0000 mg | Freq: Four times a day (QID) | INTRAMUSCULAR | Status: DC | PRN

## 2022-07-29 MED ORDER — ACETAMINOPHEN 650 MG RE SUPP: 650.0000 mg | Freq: Four times a day (QID) | RECTAL | Status: DC | PRN

## 2022-07-29 NOTE — Evaluation (Signed)
Physical Therapy Evaluation Patient Details Name: Jackson Ray MRN: [id number] DOB: December 03, 1945 Today's Date: 07/29/2022  History of Present Illness  76 y.o. male with medical history significant of prostate disease, hypertension, CSF leak, chronic headaches, and more presents to the ED with a chief complaint of syncope.   Clinical Impression  Patient is agreeable to PT evaluation. He is cooperative and pleasant throughout. He reports recent falls are related to dizziness. He is independent at baseline, working, driving, and lives with his spouse.  Orthostatic vitals taken during session without significant dizziness reported. He was able to ambulate in the hallway with supervision, with antalgic gait related to left toe pain. No loss of balance with mobility. No significant headache reported, although patient does report mild superficial posterior head pain from recent fall. Anticipate patient will have no PT needs at discharge. PT will continue to follow while in the hospital to maximize independence and safety.    Recommendations for follow up therapy are one component of a multi-disciplinary discharge planning process, led by the attending physician.  Recommendations may be updated based on patient status, additional functional criteria and insurance authorization.  Follow Up Recommendations No PT follow up      Assistance Recommended at Discharge PRN  Patient can return home with the following  Assist for transportation;Help with stairs or ramp for entrance    Equipment Recommendations None recommended by PT  Recommendations for Other Services       Functional Status Assessment Patient has had a recent decline in their functional status and demonstrates the ability to make significant improvements in function in a reasonable and predictable amount of time.     Precautions / Restrictions Precautions Precautions: Fall Restrictions Weight Bearing Restrictions: No      Mobility   Bed Mobility Overal bed mobility: Needs Assistance Bed Mobility: Supine to Sit, Sit to Supine     Supine to sit: Min guard Sit to supine: Supervision   General bed mobility comments: patient does complain of lumbar pain briefly with bed mobility. increased time required to complete tasks    Transfers Overall transfer level: Needs assistance Equipment used: 1 person hand held assist Transfers: Sit to/from Stand Sit to Stand: Supervision           General transfer comment: supervision for safety. patient tried several times before being able to stand fully    Ambulation/Gait Ambulation/Gait assistance: Min guard, Supervision Gait Distance (Feet): 100 Feet Assistive device: None Gait Pattern/deviations: Decreased stance time - left, Antalgic Gait velocity: decreased     General Gait Details: patient has antalgic gait with decreased stance on left leg (foot pain) initially that improves with increased gait distance. no loss of balance with ambulation. no headache reported with mobility. no dizziness reported.  Stairs            Wheelchair Mobility    Modified Rankin (Stroke Patients Only)       Balance Overall balance assessment: Needs assistance Sitting-balance support: Feet supported Sitting balance-Leahy Scale: Good     Standing balance support: No upper extremity supported Standing balance-Leahy Scale: Fair                               Pertinent Vitals/Pain Pain Assessment Pain Assessment: 0-10 Pain Score: 4  Pain Location: the back of his head (he reports due to recent fall), left 4th and 5th toes, lumbar area Pain Descriptors / Indicators: Discomfort Pain Intervention(s): Limited  activity within patient's tolerance, Monitored during session, Repositioned    Home Living Family/patient expects to be discharged to:: Private residence Living Arrangements: Spouse/significant other Available Help at Discharge: Family Type of Home:  House Home Access: Stairs to enter Entrance Stairs-Rails: Right Entrance Stairs-Number of Steps: 5-6   Home Layout: One level Home Equipment: Cane - single Librarian, academic (2 wheels)      Prior Function Prior Level of Function : Independent/Modified Independent;Working/employed;Driving             Mobility Comments: teaches Chief Strategy Officer at [employer], endorses 1 fall in 69mo ADLs Comments: indep     Hand Dominance        Extremity/Trunk Assessment   Upper Extremity Assessment Upper Extremity Assessment: Overall WFL for tasks assessed    Lower Extremity Assessment Lower Extremity Assessment: Overall WFL for tasks assessed;LLE deficits/detail LLE Deficits / Details: 3-5th toes painful leading to slight limp with mobility       Communication   Communication: No difficulties  Cognition Arousal/Alertness: Awake/alert Behavior During Therapy: WFL for tasks assessed/performed Overall Cognitive Status: Within Functional Limits for tasks assessed                                          General Comments General comments (skin integrity, edema, etc.): supine BP 152/89, sitting 155/95 with mild lightheadedness, standing 137/87, standing 135/89    Exercises     Assessment/Plan    PT Assessment Patient needs continued PT services  PT Problem List Decreased strength;Decreased activity tolerance;Decreased balance;Decreased mobility       PT Treatment Interventions DME instruction;Gait training;Stair training;Functional mobility training;Therapeutic activities;Therapeutic exercise    PT Goals (Current goals can be found in the Care Plan section)  Acute Rehab PT Goals Patient Stated Goal: to go home PT Goal Formulation: With patient Time For Goal Achievement: 08/12/22 Potential to Achieve Goals: Good    Frequency Min 2X/week     Co-evaluation PT/OT/SLP Co-Evaluation/Treatment: Yes Reason for Co-Treatment: For patient/therapist  safety PT goals addressed during session: Mobility/safety with mobility OT goals addressed during session: ADL's and self-care       AM-PAC PT "6 Clicks" Mobility  Outcome Measure Help needed turning from your back to your side while in a flat bed without using bedrails?: None Help needed moving from lying on your back to sitting on the side of a flat bed without using bedrails?: A Little Help needed moving to and from a bed to a chair (including a wheelchair)?: A Little Help needed standing up from a chair using your arms (e.g., wheelchair or bedside chair)?: None Help needed to walk in hospital room?: A Little Help needed climbing 3-5 steps with a railing? : A Little 6 Click Score: 20    End of Session   Activity Tolerance: Patient tolerated treatment well Patient left: in bed;with call bell/phone within reach;with bed alarm set;with family/visitor present Nurse Communication: Mobility status PT Visit Diagnosis: Unsteadiness on feet (R26.81);Muscle weakness (generalized) (M62.81)    Time: 0623-7628 PT Time Calculation (min) (ACUTE ONLY): 23 min   Charges:   PT Evaluation $PT Eval Low Complexity: 1 Low          Donna Bernard, PT, MPT   Ina Homes 07/29/2022, 1:15 PM

## 2022-07-29 NOTE — Discharge Instructions (Signed)
You were seen in the hospital after multiple episodes of passing out.   While you were admitted your blood pressure medication regimen was significantly reduced down to just lisinopril.   At time of discharge I recommend taking just lisinopril and do not take all of your other blood pressure medications for now.   You will need to follow-up with your primary care physician at the [facility] to discuss this further.   I also recommend getting a home blood pressure cuff and checking your blood pressure at least twice daily.   Please write down blood pressure readings and bring them to your primary care physician's office at the next visit.

## 2022-07-29 NOTE — ED Notes (Signed)
RN aware bed assigned ?

## 2022-07-29 NOTE — ED Notes (Signed)
Patient in [place] 

## 2022-07-29 NOTE — Consult Note (Signed)
ANTICOAGULATION CONSULT NOTE - Initial Consult  Pharmacy Consult for heparin infusion Indication: pulmonary embolus  No Known Allergies  Patient Measurements: Height: 6\' 2"  (188 cm) Weight: 81.6 kg (180 lb) IBW/kg (Calculated) : 82.2 Heparin Dosing Weight: 81.6 kg  Vital Signs: Temp: 97.6 F (36.4 C) (11/22 0519) Temp Source: Oral (11/22 0519) BP: 147/93 (11/22 0519) Pulse Rate: 67 (11/22 0519)  Labs: Recent Labs    07/28/22 1658 07/28/22 1923 07/29/22 0458  HGB 16.1  --  14.8  HCT 48.5  --  44.8  PLT 237  --  203  HEPARINUNFRC  --   --  0.65  CREATININE 2.82*  --   --   TROPONINIHS 7 10  --      Estimated Creatinine Clearance: 26.1 mL/min (A) (by C-G formula based on SCr of 2.82 mg/dL (H)).   Medical History: History reviewed. No pertinent past medical history.  Medications:  No prior anticoagulation noted   Assessment: 76 y.o. male who presents to the emergency department today via EMS because of concerns for recurrent syncopal episodes. Concern for PE.  Goal of Therapy:  Heparin level 0.3-0.7 units/ml Monitor platelets by anticoagulation protocol: Yes   Plan:  11/22:  HL @ 0458 = -0.65, therapeutic X 1 Will continue pt on current rate and recheck HL in 8 hrs on 11/22 @ 1300.   12/22, PharmD Clinical Pharmacist   07/29/2022,5:40 AM

## 2022-07-29 NOTE — ED Notes (Signed)
Orthostatic vital signs done at this time. Upon standing pt up, pt had trouble with his leg and his foot. Pt stated he was not dizzy but felt "drowsy" due to medication that he received.

## 2022-07-29 NOTE — Discharge Summary (Signed)
Physician Discharge Summary  Jackson Ray NWG:[id number] DOB: 03/19/1946 DOA: 07/28/2022  PCP: System, Provider Not In  Admit date: 07/28/2022 Discharge date: 07/29/2022  Admitted From: Home Disposition:  Home  Recommendations for Outpatient Follow-up:  Follow up with PCP in 1-2 weeks   Home Health:No Equipment/Devices:None   Discharge Condition:Stable  CODE STATUS:FULL  Diet recommendation: Heart healthy  Brief/Interim Summary: 76 y.o. male with medical history significant of prostate disease, hypertension, CSF leak, chronic headaches, and more presents to the ED with a chief complaint of syncope.  Patient reports he started having dizziness upon standing day before yesterday.  In the middle of the night, when he woke up to go to the bathroom, he passed out on the way to the bathroom and on the way back to bed.  He thought he might be unable to go to work today, but when he woke up he was feeling okay so he went to work.  He reports at work he had 4 syncopal episodes.  Last when he hit his head pretty hard and was unconscious longer, so work made him come into the hospital.  Patient reports that the syncopal episodes never happen when he is sitting or laying.  They only happen when he gets up and walks a few steps.  He feels dizziness briefly beforehand, but reports that he pretty suddenly passes out.  He denies any chest pains, dyspnea, palpitations.  He denies any new medications.  The only change in his medical regimen is that he used to straight cath at night, but he started having some bleeding yesterday, so they advised him to stop straight cathing.  Patient reports he has had dizziness and syncopal episodes like this before when he had a CSF leak.  It is difficult to piece together, but the CSF leak is apparently spontaneous, and he had several blood patches before procedure was done to permanently fix the problem.  Patient reports that since that time he has had a headache every day.   Most days it is mild, some days it is worse.  He takes nortriptyline at night for this.  His headache is been no worse than normal recently.  Patient reports it isstopped up, so he has to mouth breathe which sometimes makes him feel short of breath.  He has felt no more short of breath recently than his normal.  He did hit the back of his head today on the right side.  He has a small tender knot.  He reports no change in vision, no change in hearing, no asymmetric weakness, no trouble word finding, no trouble swallowing.  Patient has no other complaints at this time.   Suspected etiology of syncopal events is orthostatic hypotension secondary to overtreatment of blood pressure.  Patient is on amlodipine, hydralazine, hydrochlorothiazide, lisinopril.  All these medications except for lisinopril were held on admission.  Blood pressure remained controlled despite not receiving his medications.  Patient ambulated with physical therapy.  No follow-up recommended.  No recurrence of syncopal or presyncopal events.  VQ scan negative for pulmonary embolism.  Echocardiogram reassuring, normal ejection fraction with no noted valvulopathy.  I suspect patient's blood pressure was being aggressively treated as an outpatient and had not he could not tolerate.  At time of discharge would recommend holding of all blood pressure medications except for lisinopril 40 mg daily and Flomax 0.4 mg daily.  Home amlodipine, hydrochlorothiazide, hydralazine have been held.  Patient is advised to get a home blood pressure cuff and  check blood pressure twice daily.  Bring blood pressure readings to next primary care physician visit office.  Patient is established with [facility].  No PCP listed in chart.  Patient will be advised that he needs follow-up with his primary care physician within 7 to 10 days of discharge.    Discharge Diagnoses:  Principal Problem:   Syncope Active Problems:   Elevated serum creatinine   Elevated d-dimer    Essential hypertension   Chronic headaches  Recurrent syncope Strongly suspect orthostatic hypotension in the setting of aggressively treated blood pressure.  Adjustments made in blood pressure regimen as above.  Continue on lisinopril, hold amlodipine, hydrochlorothiazide, hydralazine.  Echocardiogram reassuring.  VQ scan negative for PE.  EKG was sinus rhythm.  Worked with physical therapy and Occupational Therapy.  No follow-up recommended.  No recurrence of syncopal or presyncopal events.  Discharge Instructions  Discharge Instructions     Diet - low sodium heart healthy   Complete by: As directed    Increase activity slowly   Complete by: As directed       Allergies as of 07/29/2022   No Known Allergies      Medication List     STOP taking these medications    amLODipine 10 MG tablet Commonly known as: NORVASC   finasteride 5 MG tablet Commonly known as: PROSCAR   hydrALAZINE 50 MG tablet Commonly known as: APRESOLINE   hydrochlorothiazide 25 MG tablet Commonly known as: HYDRODIURIL       TAKE these medications    lisinopril 40 MG tablet Commonly known as: ZESTRIL Take 1 tablet by mouth daily.   tamsulosin 0.4 MG Caps capsule Commonly known as: FLOMAX Take 0.8 mg by mouth daily after supper.        No Known Allergies  Consultations: None   Procedures/Studies: ECHOCARDIOGRAM COMPLETE  Result Date: 07/29/2022    ECHOCARDIOGRAM REPORT   Patient Name:   Jackson Ray Date of Exam: 07/28/2022 Medical Rec #:  [id number]    Height:       74.0 in Accession #:    [id number]   Weight:       180.0 lb Date of Birth:  11/05/1945    BSA:          2.078 m Patient Age:    76 years     BP:           124/55 mmHg Patient Gender: M            HR:           109 bpm. Exam Location:  [facility] Procedure: 2D Echo, Cardiac Doppler and Color Doppler Indications:     R55 Syncope  History:         Patient has no prior history of Echocardiogram examinations.  Sonographer:      Daphine Deutscher RDCS Referring Phys:  [id number] ASIA B ZIERLE-GHOSH Diagnosing Phys: Arnoldo Hooker MD IMPRESSIONS  1. Left ventricular ejection fraction, by estimation, is 60 to 65%. The left ventricle has normal function. The left ventricle has no regional wall motion abnormalities. Left ventricular diastolic parameters were normal.  2. Right ventricular systolic function is normal. The right ventricular size is normal.  3. The mitral valve is normal in structure. Mild mitral valve regurgitation.  4. The aortic valve is normal in structure. Aortic valve regurgitation is mild. FINDINGS  Left Ventricle: Left ventricular ejection fraction, by estimation, is 60 to 65%. The left ventricle has normal function. The left ventricle has  no regional wall motion abnormalities. The left ventricular internal cavity size was normal in size. There is  no left ventricular hypertrophy. Left ventricular diastolic parameters were normal. Right Ventricle: The right ventricular size is normal. No increase in right ventricular wall thickness. Right ventricular systolic function is normal. Left Atrium: Left atrial size was normal in size. Right Atrium: Right atrial size was normal in size. Pericardium: There is no evidence of pericardial effusion. Mitral Valve: The mitral valve is normal in structure. Mild mitral valve regurgitation. Tricuspid Valve: The tricuspid valve is normal in structure. Tricuspid valve regurgitation is mild. Aortic Valve: The aortic valve is normal in structure. Aortic valve regurgitation is mild. Pulmonic Valve: The pulmonic valve was normal in structure. Pulmonic valve regurgitation is trivial. Aorta: The aortic root and ascending aorta are structurally normal, with no evidence of dilitation. IAS/Shunts: No atrial level shunt detected by color flow Doppler.  LEFT VENTRICLE PLAX 2D LVIDd:         4.10 cm   Diastology LVIDs:         2.20 cm   LV e' medial:    5.97 cm/s LV PW:         1.00 cm   LV E/e' medial:   8.7 LV IVS:        1.00 cm   LV e' lateral:   5.75 cm/s LVOT diam:     2.10 cm   LV E/e' lateral: 9.0 LV SV:         79 LV SV Index:   38 LVOT Area:     3.46 cm  RIGHT VENTRICLE RV Basal diam:  4.00 cm RV S prime:     16.10 cm/s TAPSE (M-mode): 2.6 cm LEFT ATRIUM             Index       RIGHT ATRIUM           Index LA diam:        3.40 cm 1.64 cm/m  RA Area:     11.30 cm LA Vol (A2C):   16.4 ml 7.89 ml/m  RA Volume:   29.60 ml  14.24 ml/m LA Vol (A4C):   10.0 ml 4.81 ml/m LA Biplane Vol: 13.1 ml 6.30 ml/m  AORTIC VALVE LVOT Vmax:   143.33 cm/s LVOT Vmean:  92.667 cm/s LVOT VTI:    0.229 m  AORTA Ao Root diam: 4.10 cm Ao Asc diam:  2.80 cm MITRAL VALVE MV Area (PHT): 3.50 cm     SHUNTS MV Decel Time: 217 msec     Systemic VTI:  0.23 m MV E velocity: 51.90 cm/s   Systemic Diam: 2.10 cm MV A velocity: 109.00 cm/s MV E/A ratio:  0.48 Arnoldo Hooker MD Electronically signed by Arnoldo Hooker MD Signature Date/Time: 07/29/2022/3:23:25 PM    Final    NM Pulmonary Perfusion  Result Date: 07/29/2022 CLINICAL DATA:  Near syncopal episodes.  Positive D-dimer. EXAM: NUCLEAR MEDICINE PERFUSION LUNG SCAN TECHNIQUE: Perfusion images were obtained in multiple projections after intravenous injection of radiopharmaceutical. Ventilation scans intentionally deferred if perfusion scan and chest x-ray adequate for interpretation during COVID 19 epidemic. RADIOPHARMACEUTICALS:  4.04 mCi Tc-58m MAA IV COMPARISON:  Chest radiograph dated 07/28/2022 FINDINGS: Relatively homogeneous distribution of radiotracer. No definite peripheral wedge-shaped defects. Linear perfusion defect in the basilar left lower lobe likely corresponds to linear atelectasis on chest radiograph. IMPRESSION: Pulmonary embolism absent. Electronically Signed   By: Agustin Cree M.D.   On: 07/29/2022 09:19  DG Chest Portable 1 View  Result Date: 07/28/2022 CLINICAL DATA:  tachypnea EXAM: PORTABLE CHEST 1 VIEW.  Patient is rotated. COMPARISON:  None  Available. FINDINGS: The heart and mediastinal contours are within normal limits. Question left base airspace opacity with limited evaluation due to patient's positioning and left costophrenic angle collimation off view. No pulmonary edema. No right pleural effusion. No pneumothorax. No acute osseous abnormality. IMPRESSION: Question left base airspace opacity with limited evaluation due to patient's positioning and left costophrenic angle collimation off view. Recommend further evaluation with PA and lateral view of the chest. Electronically Signed   By: Tish Frederickson M.D.   On: 07/28/2022 21:15   CT Head Wo Contrast  Result Date: 07/28/2022 CLINICAL DATA:  Fall EXAM: CT HEAD WITHOUT CONTRAST CT CERVICAL SPINE WITHOUT CONTRAST TECHNIQUE: Multidetector CT imaging of the head and cervical spine was performed following the standard protocol without intravenous contrast. Multiplanar CT image reconstructions of the cervical spine were also generated. RADIATION DOSE REDUCTION: This exam was performed according to the departmental dose-optimization program which includes automated exposure control, adjustment of the mA and/or kV according to patient size and/or use of iterative reconstruction technique. COMPARISON:  None Available. FINDINGS: CT HEAD FINDINGS Brain: The brainstem, cerebellum, cerebral peduncles, thalami, basal ganglia, basilar cisterns, and ventricular system appear within normal limits. No intracranial hemorrhage, mass lesion, or acute CVA. Vascular: There is atherosclerotic calcification of the cavernous carotid arteries bilaterally. Slight prominence of the left cavernous carotid artery likely related to tortuosity and mild ectasia. Skull: Unremarkable Sinuses/Orbits: Substantial subtotal opacification of the frontal sinuses with opacification of multiple ethmoid air cells as well as chronic bilateral maxillary and chronic left sphenoid sinusitis. Appearance compatible with substantial chronic  paranasal sinusitis. Other: Tiny potential metal foreign body just below the cutaneous surface along the right lateral side of the nose on image 1 series 2. CT CERVICAL SPINE FINDINGS Alignment: No vertebral subluxation is observed. Skull base and vertebrae: Spurring and loss of articular space at the anterior C1-2 articulation compatible with degenerative arthropathy. Partial solid interbody fusion at C6-7. No fracture or acute bony findings. Soft tissues and spinal canal: Atherosclerotic calcification of the aortic arch bilateral common carotid arteries. Disc levels: Uncinate and facet spurring cause right foraminal impingement at the C3-4, C4-5, and C5-6 levels; and left foraminal impingement at C2-3, C3-4, C4-5, C5-6, and T1-2. Upper chest: Biapical pleuroparenchymal scarring. Other: No supplemental non-categorized findings. IMPRESSION: 1. No acute intracranial findings or acute cervical spine findings. 2. Substantial chronic paranasal sinusitis. 3. Tiny metal foreign body just below the cutaneous surface along the right lateral side of the nose. This has been present since at least 2008. 4. Atherosclerosis. 5. Multilevel foraminal impingement in the cervical spine due to spondylosis and degenerative disc disease. Aortic Atherosclerosis (ICD10-I70.0). Electronically Signed   By: Gaylyn Rong M.D.   On: 07/28/2022 17:53   CT Cervical Spine Wo Contrast  Result Date: 07/28/2022 CLINICAL DATA:  Fall EXAM: CT HEAD WITHOUT CONTRAST CT CERVICAL SPINE WITHOUT CONTRAST TECHNIQUE: Multidetector CT imaging of the head and cervical spine was performed following the standard protocol without intravenous contrast. Multiplanar CT image reconstructions of the cervical spine were also generated. RADIATION DOSE REDUCTION: This exam was performed according to the departmental dose-optimization program which includes automated exposure control, adjustment of the mA and/or kV according to patient size and/or use of  iterative reconstruction technique. COMPARISON:  None Available. FINDINGS: CT HEAD FINDINGS Brain: The brainstem, cerebellum, cerebral peduncles, thalami, basal ganglia, basilar  cisterns, and ventricular system appear within normal limits. No intracranial hemorrhage, mass lesion, or acute CVA. Vascular: There is atherosclerotic calcification of the cavernous carotid arteries bilaterally. Slight prominence of the left cavernous carotid artery likely related to tortuosity and mild ectasia. Skull: Unremarkable Sinuses/Orbits: Substantial subtotal opacification of the frontal sinuses with opacification of multiple ethmoid air cells as well as chronic bilateral maxillary and chronic left sphenoid sinusitis. Appearance compatible with substantial chronic paranasal sinusitis. Other: Tiny potential metal foreign body just below the cutaneous surface along the right lateral side of the nose on image 1 series 2. CT CERVICAL SPINE FINDINGS Alignment: No vertebral subluxation is observed. Skull base and vertebrae: Spurring and loss of articular space at the anterior C1-2 articulation compatible with degenerative arthropathy. Partial solid interbody fusion at C6-7. No fracture or acute bony findings. Soft tissues and spinal canal: Atherosclerotic calcification of the aortic arch bilateral common carotid arteries. Disc levels: Uncinate and facet spurring cause right foraminal impingement at the C3-4, C4-5, and C5-6 levels; and left foraminal impingement at C2-3, C3-4, C4-5, C5-6, and T1-2. Upper chest: Biapical pleuroparenchymal scarring. Other: No supplemental non-categorized findings. IMPRESSION: 1. No acute intracranial findings or acute cervical spine findings. 2. Substantial chronic paranasal sinusitis. 3. Tiny metal foreign body just below the cutaneous surface along the right lateral side of the nose. This has been present since at least 2008. 4. Atherosclerosis. 5. Multilevel foraminal impingement in the cervical spine  due to spondylosis and degenerative disc disease. Aortic Atherosclerosis (ICD10-I70.0). Electronically Signed   By: Gaylyn Rong M.D.   On: 07/28/2022 17:53      Subjective: Seen and examined on the day of discharge.  Stable no distress.  No recurrence of syncopal or presyncopal events.  No chest pain.  No shortness of breath.  Stable for discharge home at this time.  Discharge Exam: Vitals:   07/29/22 1200 07/29/22 1525  BP:  (!) 128/96  Pulse:  63  Resp:  (!) 28  Temp: (!) 97.5 F (36.4 C) 97.8 F (36.6 C)  SpO2:  100%   Vitals:   07/29/22 0722 07/29/22 1042 07/29/22 1200 07/29/22 1525  BP: (!) 142/88 (!) 125/94  (!) 128/96  Pulse: 73 78  63  Resp: (!) 24 18  (!) 28  Temp: 97.6 F (36.4 C)  (!) 97.5 F (36.4 C) 97.8 F (36.6 C)  TempSrc: Oral     SpO2: 100% 100%  100%  Weight:      Height:        General: Pt is alert, awake, not in acute distress Cardiovascular: RRR, S1/S2 +, no rubs, no gallops Respiratory: CTA bilaterally, no wheezing, no rhonchi Abdominal: Soft, NT, ND, bowel sounds + Extremities: no edema, no cyanosis    The results of significant diagnostics from this hospitalization (including imaging, microbiology, ancillary and laboratory) are listed below for reference.     Microbiology: No results found for this or any previous visit (from the past 240 hour(s)).   Labs: BNP (last 3 results) No results for input(s): "BNP" in the last 8760 hours. Basic Metabolic Panel: Recent Labs  Lab 07/28/22 1658 07/29/22 0458  NA 137 137  K 4.0 3.7  CL 108 110  CO2 19* 18*  GLUCOSE 116* 99  BUN 52* 53*  CREATININE 2.82* 2.52*  CALCIUM 9.0 8.5*  MG  --  2.1   Liver Function Tests: Recent Labs  Lab 07/28/22 1658 07/29/22 0458  AST 22 23  ALT 20 19  ALKPHOS 105  92  BILITOT 1.1 0.9  PROT 6.9 6.6  ALBUMIN 3.8 3.5   No results for input(s): "LIPASE", "AMYLASE" in the last 168 hours. No results for input(s): "AMMONIA" in the last 168  hours. CBC: Recent Labs  Lab 07/28/22 1658 07/29/22 0458  WBC 10.1 11.9*  NEUTROABS 7.2 8.1*  HGB 16.1 14.8  HCT 48.5 44.8  MCV 90.3 91.2  PLT 237 203   Cardiac Enzymes: No results for input(s): "CKTOTAL", "CKMB", "CKMBINDEX", "TROPONINI" in the last 168 hours. BNP: Invalid input(s): "POCBNP" CBG: No results for input(s): "GLUCAP" in the last 168 hours. D-Dimer Recent Labs    07/28/22 1923  DDIMER 1.23*   Hgb A1c No results for input(s): "HGBA1C" in the last 72 hours. Lipid Profile No results for input(s): "CHOL", "HDL", "LDLCALC", "TRIG", "CHOLHDL", "LDLDIRECT" in the last 72 hours. Thyroid function studies Recent Labs    07/29/22 0458  TSH 0.467   Anemia work up No results for input(s): "VITAMINB12", "FOLATE", "FERRITIN", "TIBC", "IRON", "RETICCTPCT" in the last 72 hours. Urinalysis    Component Value Date/Time   COLORURINE YELLOW (A) 07/28/2022 1901   APPEARANCEUR CLEAR (A) 07/28/2022 1901   LABSPEC 1.015 07/28/2022 1901   PHURINE 5.0 07/28/2022 1901   GLUCOSEU NEGATIVE 07/28/2022 1901   HGBUR MODERATE (A) 07/28/2022 1901   BILIRUBINUR NEGATIVE 07/28/2022 1901   KETONESUR NEGATIVE 07/28/2022 1901   PROTEINUR 100 (A) 07/28/2022 1901   NITRITE NEGATIVE 07/28/2022 1901   LEUKOCYTESUR NEGATIVE 07/28/2022 1901   Sepsis Labs Recent Labs  Lab 07/28/22 1658 07/29/22 0458  WBC 10.1 11.9*   Microbiology No results found for this or any previous visit (from the past 240 hour(s)).   Time coordinating discharge: Over 30 minutes  SIGNED:   Tresa MooreSudheer B Shekia Kuper, MD  [facility] 07/29/2022, 4:10 PM Pager   If 7PM-7AM, please contact night-coverage

## 2022-07-29 NOTE — Evaluation (Signed)
Occupational Therapy Evaluation Patient Details Name: Jackson Ray MRN: [id number] DOB: September 27, 1945 Today's Date: 07/29/2022   History of Present Illness 76 y.o. male with medical history significant of prostate disease, hypertension, CSF leak, chronic headaches, and more presents to the ED with a chief complaint of syncope.   Clinical Impression   Pt was seen for OT evaluation and cotx with PT this date to optimize outcomes and pt safety this date. Prior to hospital admission, pt was independent and working, endorsing only this fall in past 73mo but with hx of dizziness/lightheadedness with positional changes in the past. Pt lives with his spouse in a 1 story home with 5-6 steps. Pt presents to acute OT demonstrating impaired ADL performance and functional mobility 2/2 impaired balance and L 3-5 toe pain (See OT problem list). Pt currently requires CGA for ADL mobility and PRN MIN A for LB ADL tasks. Pt endorsed some lightheadedness with sup>sit but did not worsen with standing/ambulating. Orthostatic vitals taken (see Vitals section for additional detail). Do not anticipate additional OT needs at this time. Will sign off.       Recommendations for follow up therapy are one component of a multi-disciplinary discharge planning process, led by the attending physician.  Recommendations may be updated based on patient status, additional functional criteria and insurance authorization.   Follow Up Recommendations  No OT follow up     Assistance Recommended at Discharge PRN  Patient can return home with the following A little help with walking and/or transfers;A little help with bathing/dressing/bathroom    Functional Status Assessment  Patient has had a recent decline in their functional status and demonstrates the ability to make significant improvements in function in a reasonable and predictable amount of time.  Equipment Recommendations  None recommended by OT    Recommendations for Other  Services       Precautions / Restrictions Precautions Precautions: Fall Restrictions Weight Bearing Restrictions: No      Mobility Bed Mobility Overal bed mobility: Modified Independent                  Transfers Overall transfer level: Needs assistance   Transfers: Sit to/from Stand Sit to Stand: Min guard, +2 safety/equipment                  Balance Overall balance assessment: Needs assistance Sitting-balance support: Feet supported, No upper extremity supported Sitting balance-Leahy Scale: Good     Standing balance support: During functional activity, No upper extremity supported Standing balance-Leahy Scale: Fair                             ADL either performed or assessed with clinical judgement   ADL                                         General ADL Comments: Pt required MIN A for LB ADL tasks, typically indep with ADL but L toes pain limiting indep today     Vision         Perception     Praxis      Pertinent Vitals/Pain Pain Assessment Pain Assessment: 0-10 Pain Score: 4  Pain Location: low back, tender to touch; headache (that doesn't feel like previous CSF leak headache) Pain Descriptors / Indicators: Tender, Headache Pain Intervention(s): Monitored during session, Repositioned, Patient requesting  pain meds-RN notified     Hand Dominance     Extremity/Trunk Assessment Upper Extremity Assessment Upper Extremity Assessment: Overall WFL for tasks assessed   Lower Extremity Assessment Lower Extremity Assessment: Overall WFL for tasks assessed;LLE deficits/detail LLE Deficits / Details: 3-5th toes painful leading to slight limp with mobility       Communication Communication Communication: No difficulties   Cognition Arousal/Alertness: Awake/alert Behavior During Therapy: WFL for tasks assessed/performed Overall Cognitive Status: Within Functional Limits for tasks assessed                                        General Comments  supine BP 152/89, sitting 155/95 with mild lightheadedness, standing 137/87, standing 42min 135/89    Exercises Other Exercises Other Exercises: Pt ambulated in hall with CGA (+2 for safety/IV mgt), mildly unsteady   Shoulder Instructions      Home Living Family/patient expects to be discharged to:: Private residence Living Arrangements: Spouse/significant other Available Help at Discharge: Family Type of Home: House Home Access: Stairs to enter Technical brewer of Steps: 5-6 Entrance Stairs-Rails: Right Home Layout: One level     Bathroom Shower/Tub: Tub/shower unit         Home Equipment: Cane - single Barista (2 wheels)          Prior Functioning/Environment Prior Level of Function : Independent/Modified Independent;Working/employed;Driving             Mobility Comments: 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 [employer], endorses 1 fall in 19mo ADLs Comments: indep        OT Problem List: Pain;Impaired balance (sitting and/or standing)      OT Treatment/Interventions:      OT Goals(Current goals can be found in the care plan section) Acute Rehab OT Goals Patient Stated Goal: go home OT Goal Formulation: All assessment and education complete, DC therapy  OT Frequency:      Co-evaluation PT/OT/SLP Co-Evaluation/Treatment: Yes Reason for Co-Treatment: For patient/therapist safety PT goals addressed during session: Mobility/safety with mobility OT goals addressed during session: ADL's and self-care      AM-PAC OT "6 Clicks" Daily Activity     Outcome Measure Help from another person eating meals?: None Help from another person taking care of personal grooming?: None Help from another person toileting, which includes using toliet, bedpan, or urinal?: None Help from another person bathing (including washing, rinsing, drying)?: None Help from another person to put on and taking off  regular upper body clothing?: None Help from another person to put on and taking off regular lower body clothing?: A Little 6 Click Score: 23   End of Session Nurse Communication: Mobility status  Activity Tolerance: Patient tolerated treatment well Patient left: in bed;with call bell/phone within reach;with bed alarm set  OT Visit Diagnosis: Other abnormalities of gait and mobility (R26.89);Pain Pain - Right/Left: Left Pain - part of body: Ankle and joints of foot                Time: 1138-1200 OT Time Calculation (min): 22 min Charges:  OT General Charges $OT Visit: 1 Visit OT Evaluation $OT Eval Low Complexity: 1 Low  Ardeth Perfect., MPH, MS, OTR/L ascom [phone] 07/29/22, 1:17 PM
